# Patient Record
Sex: Male | Born: 1970
Health system: Southern US, Community
[De-identification: ages and names within clinical notes are randomized; demographics above are authoritative.]

## PROBLEM LIST (undated history)

## (undated) DIAGNOSIS — I10 Essential (primary) hypertension: Secondary | ICD-10-CM

## (undated) DIAGNOSIS — E785 Hyperlipidemia, unspecified: Secondary | ICD-10-CM

## (undated) DIAGNOSIS — F419 Anxiety disorder, unspecified: Secondary | ICD-10-CM

## (undated) HISTORY — DX: Essential (primary) hypertension: I10

## (undated) HISTORY — DX: Hyperlipidemia, unspecified: E78.5

## (undated) HISTORY — PX: NO PAST SURGERIES: SHX2092

## (undated) HISTORY — DX: Anxiety disorder, unspecified: F41.9

---

## 2007-11-07 ENCOUNTER — Ambulatory Visit: Payer: Self-pay | Admitting: Family Medicine

## 2009-11-07 IMAGING — US ABDOMEN ULTRASOUND
1 series · 17 of 25 positions shown · non-contrast
Comparison: none

REASON FOR EXAM: abdominal pain
COMMENTS:

[Series 1: abdomen ultrasound · 17 of 63 slices shown]
[im 1/63]
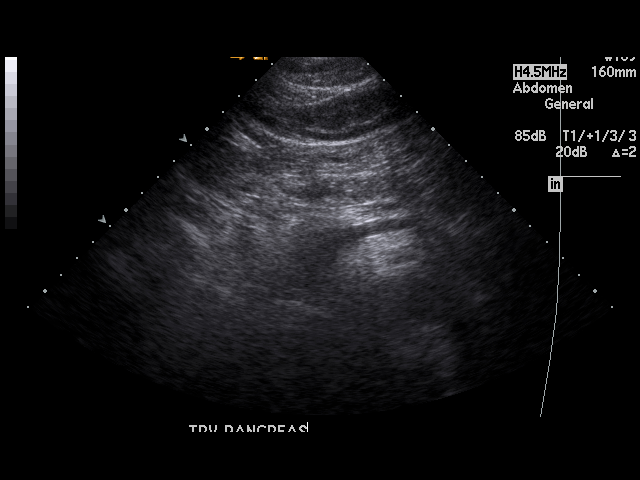
[im 6/63]
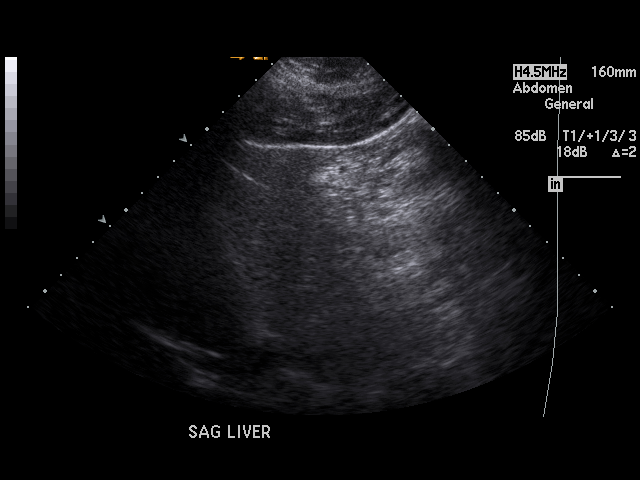
[im 8/63]
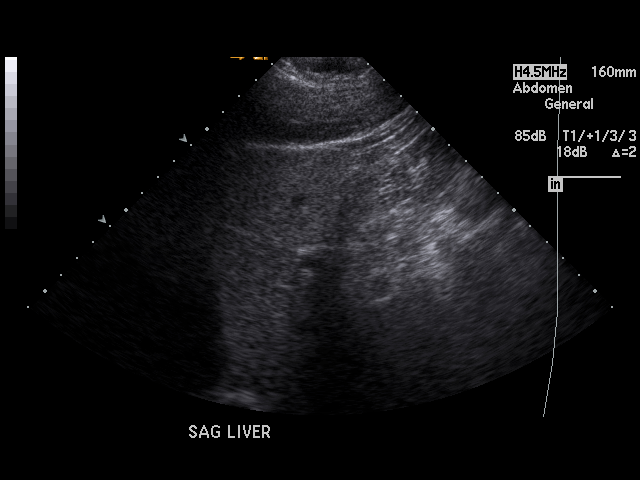
[im 13/63]
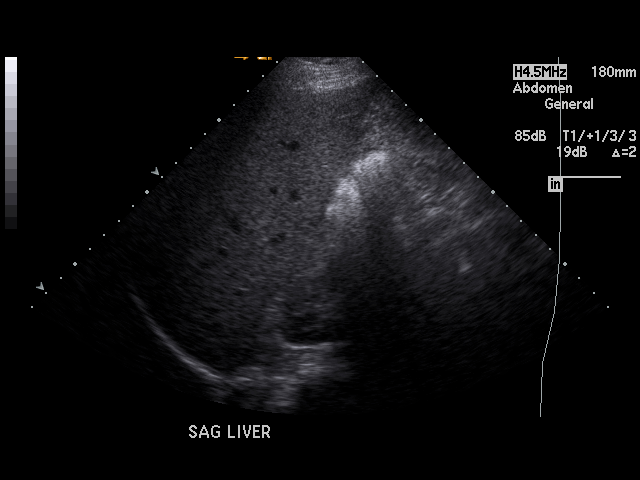
[im 16/63]
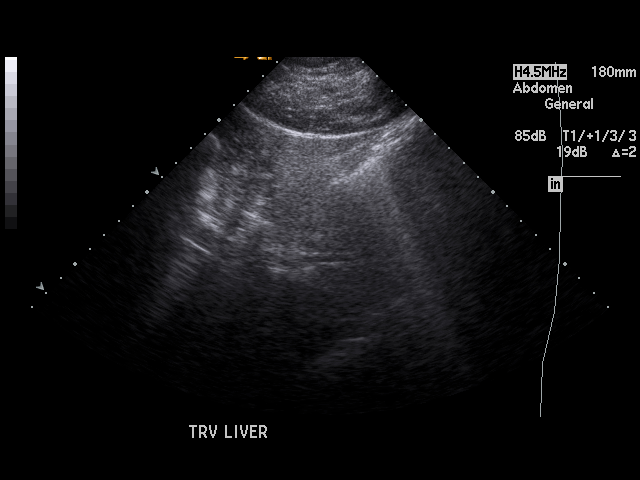
[im 21/63]
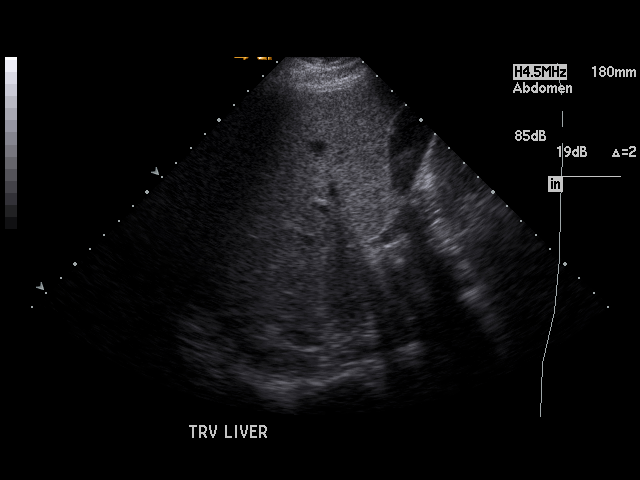
[im 24/63]
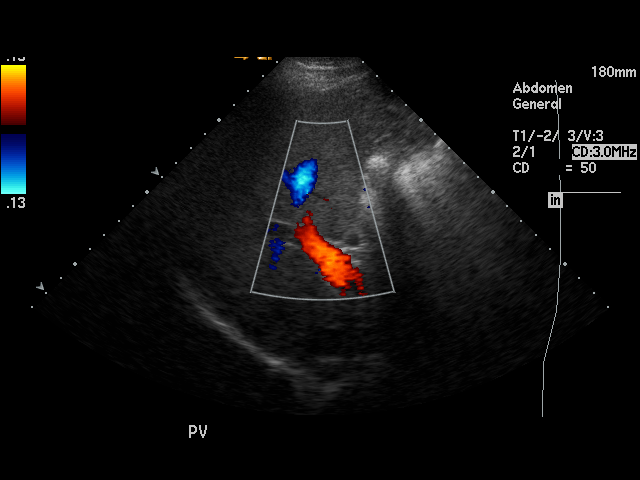
[im 29/63]
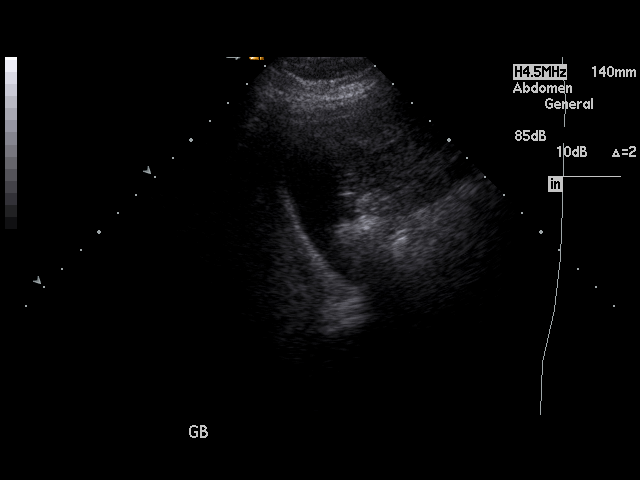
[im 32/63]
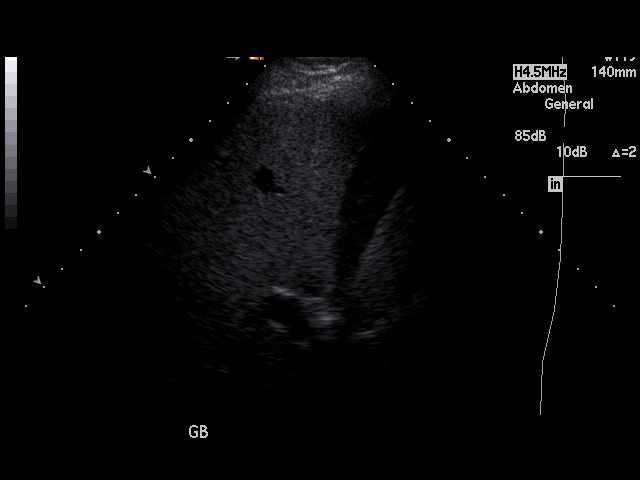
[im 34/63]
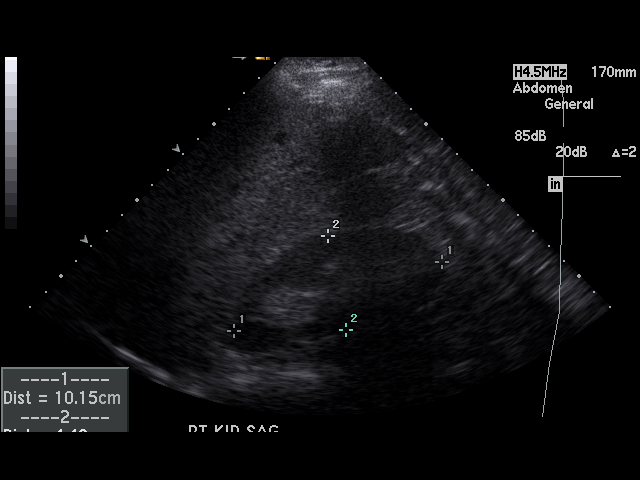
[im 39/63]
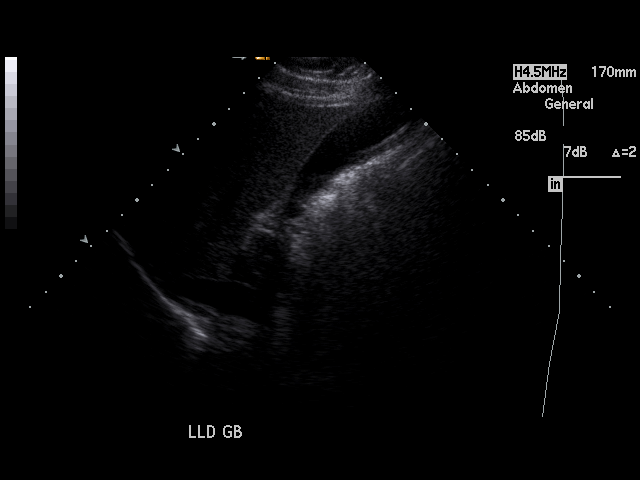
[im 42/63]
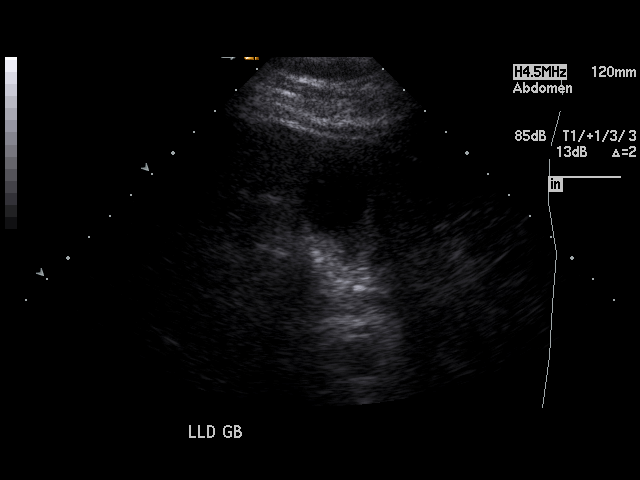
[im 47/63]
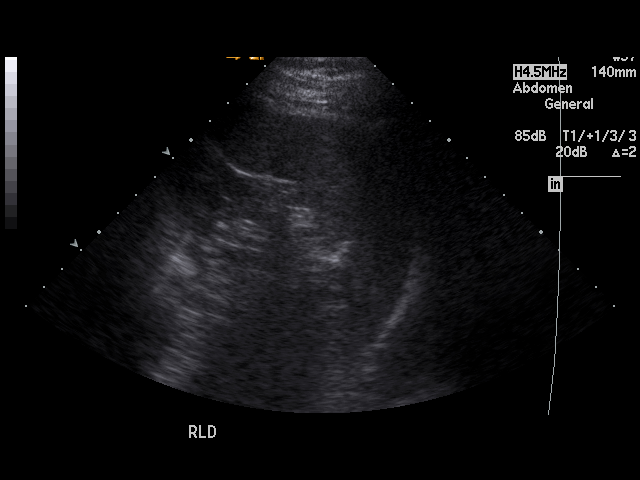
[im 50/63]
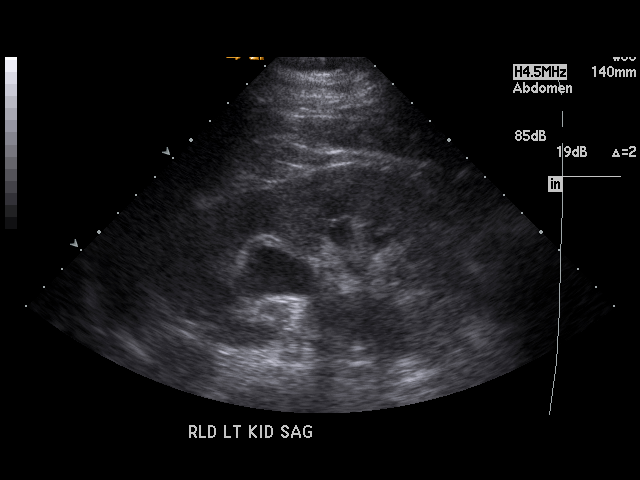
[im 55/63]
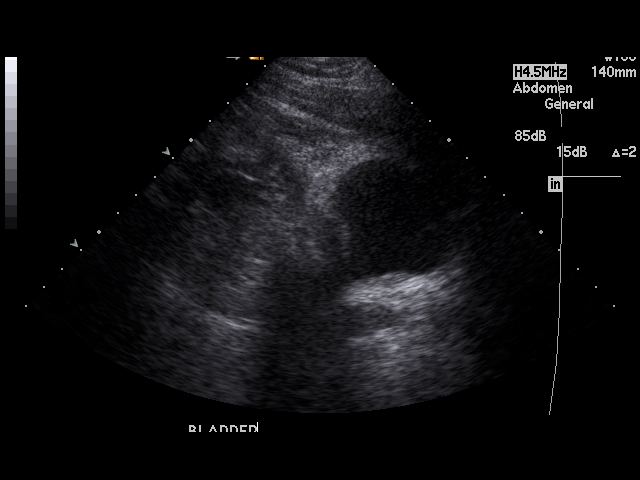
[im 57/63]
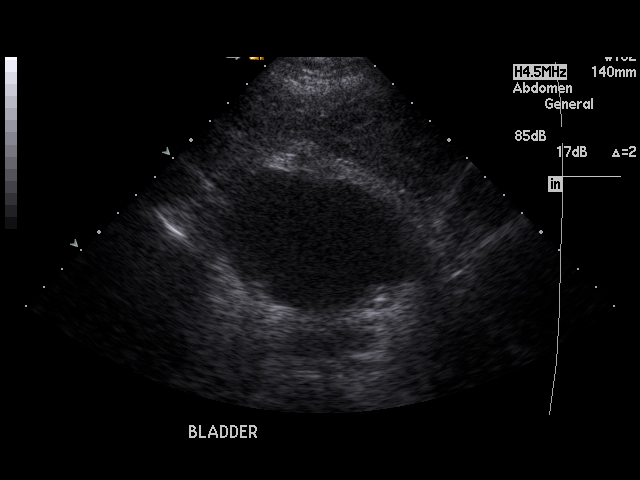
[im 63/63]
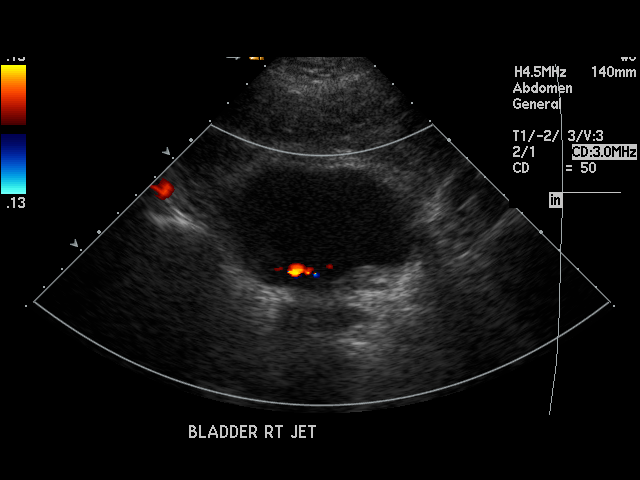

[17 of 25 positions shown; findings below may reference images not displayed]

PROCEDURE:     US  - US ABDOMEN GENERAL SURVEY  - November 07, 2007  [DATE]

RESULT:     The patient is undergoing evaluation for LEFT lower quadrant
abdominal discomfort.

The liver exhibits normal echotexture with no evidence of mass nor of ductal
dilation. Portal venous flow is normal in direction toward the liver. The
gallbladder is adequately distended with no evidence of stones, wall
thickening, or pericholecystic fluid. There is no positive sonographic
Murphy's sign. The common bile duct measures 4.5 mm in diameter. Evaluation
of the pancreas was limited due to bowel gas. The spleen is normal in size
at 10.1 cm. The abdominal aorta is normal in caliber. There is a trace of
hydronephrosis on the LEFT and there is a stone in the region of the
ureterovesical junction measuring 6 mm in greatest dimension. The RIGHT
kidney is normal in appearance.
IMPRESSION: 1. There is mild hydronephrosis on the LEFT which is likely secondary to a
calcified shadowing stone at the LEFT ureterovesical junction.
2. I do not see evidence of acute hepatobiliary abnormality. Evaluation of
the pancreas was limited due to bowel gas.

## 2010-06-14 ENCOUNTER — Ambulatory Visit: Payer: Self-pay | Admitting: Internal Medicine

## 2013-01-09 ENCOUNTER — Emergency Department: Payer: Self-pay | Admitting: Emergency Medicine

## 2013-01-09 LAB — CBC WITH DIFFERENTIAL/PLATELET
Basophil #: 0 10*3/uL (ref 0.0–0.1)
Basophil %: 0.4 %
Eosinophil #: 0.2 10*3/uL (ref 0.0–0.7)
Eosinophil %: 1.9 %
HCT: 40.1 % (ref 40.0–52.0)
HGB: 13.6 g/dL (ref 13.0–18.0)
Lymphocyte #: 2.9 10*3/uL (ref 1.0–3.6)
Lymphocyte %: 25.3 %
MCH: 28.2 pg (ref 26.0–34.0)
MCHC: 34.1 g/dL (ref 32.0–36.0)
MCV: 83 fL (ref 80–100)
Monocyte #: 1.1 x10 3/mm — ABNORMAL HIGH (ref 0.2–1.0)
Monocyte %: 9.4 %
Neutrophil #: 7.2 10*3/uL — ABNORMAL HIGH (ref 1.4–6.5)
Neutrophil %: 63 %
Platelet: 312 10*3/uL (ref 150–440)
RBC: 4.84 10*6/uL (ref 4.40–5.90)
RDW: 12.7 % (ref 11.5–14.5)
WBC: 11.4 10*3/uL — ABNORMAL HIGH (ref 3.8–10.6)

## 2013-01-10 LAB — BASIC METABOLIC PANEL
Anion Gap: 3 — ABNORMAL LOW (ref 7–16)
BUN: 9 mg/dL (ref 7–18)
Calcium, Total: 9.3 mg/dL (ref 8.5–10.1)
Chloride: 102 mmol/L (ref 98–107)
Co2: 30 mmol/L (ref 21–32)
Creatinine: 1.14 mg/dL (ref 0.60–1.30)
EGFR (African American): >60
EGFR (Non-African Amer.): >60
Glucose: 73 mg/dL (ref 65–99)
Osmolality: 267 (ref 275–301)
Potassium: 3.8 mmol/L (ref 3.5–5.1)
Sodium: 135 mmol/L — ABNORMAL LOW (ref 136–145)

## 2013-01-15 LAB — CULTURE, BLOOD (SINGLE)

## 2014-08-08 ENCOUNTER — Ambulatory Visit: Payer: Self-pay | Admitting: Family Medicine

## 2014-08-13 ENCOUNTER — Encounter: Payer: Self-pay | Admitting: Family Medicine

## 2014-08-13 ENCOUNTER — Ambulatory Visit (INDEPENDENT_AMBULATORY_CARE_PROVIDER_SITE_OTHER): Payer: 59 | Admitting: Family Medicine

## 2014-08-13 VITALS — BP 124/80 | HR 66 | Temp 97.2°F | Resp 16 | Ht 72.0 in | Wt 207.3 lb

## 2014-08-13 DIAGNOSIS — E785 Hyperlipidemia, unspecified: Secondary | ICD-10-CM

## 2014-08-13 DIAGNOSIS — Z720 Tobacco use: Secondary | ICD-10-CM | POA: Diagnosis not present

## 2014-08-13 DIAGNOSIS — F419 Anxiety disorder, unspecified: Secondary | ICD-10-CM

## 2014-08-13 DIAGNOSIS — I10 Essential (primary) hypertension: Secondary | ICD-10-CM

## 2014-08-13 MED ORDER — CLONAZEPAM 1 MG PO TABS: 1.0000 mg | ORAL_TABLET | Freq: Two times a day (BID) | ORAL | Status: DC

## 2014-08-13 MED ORDER — ATORVASTATIN CALCIUM 40 MG PO TABS: 40.0000 mg | ORAL_TABLET | Freq: Every day | ORAL | Status: DC

## 2014-08-13 MED ORDER — LISINOPRIL 20 MG PO TABS: 20.0000 mg | ORAL_TABLET | Freq: Every day | ORAL | Status: DC

## 2014-08-13 NOTE — Progress Notes (Signed)
Name: John Ayers   MRN: [id number]    DOB: [birth year]   Date:08/13/2014       Progress Note  Subjective  Chief Complaint  Chief Complaint  Patient presents with  . Hypertension  . Hyperlipidemia  . Anxiety    Hypertension This is a chronic problem. The current episode started more than 1 year ago. The problem is unchanged. The problem is controlled. Associated symptoms include anxiety, headaches and shortness of breath. Pertinent negatives include no blurred vision, chest pain, neck pain, orthopnea or palpitations. There are no associated agents to hypertension. Risk factors for coronary artery disease include dyslipidemia, male gender, sedentary lifestyle, smoking/tobacco exposure and stress. Past treatments include ACE inhibitors. The current treatment provides moderate improvement. Compliance problems: Intermittently noncompliant.   Hyperlipidemia This is a chronic problem. The current episode started more than 1 year ago. Condition status: Patient has not gotten lab drawn. Factors aggravating his hyperlipidemia include fatty foods. Associated symptoms include shortness of breath. Pertinent negatives include no chest pain, focal weakness or myalgias. Risk factors for coronary artery disease include dyslipidemia, hypertension, a sedentary lifestyle and male sex.  Anxiety Presents for follow-up visit. Onset was more than 5 years ago. Symptoms include decreased concentration, excessive worry, insomnia, irritability, muscle tension, nervous/anxious behavior, panic, restlessness, shortness of breath and suicidal ideas. Patient reports no chest pain, dizziness, nausea or palpitations. Symptoms occur most days. The severity of symptoms is moderate. The symptoms are aggravated by caffeine, family issues, social activities, specific phobias, work stress and medication withdrawal.   Risk factors include a major life event. His past medical history is significant for anxiety/panic attacks. Past  treatments include benzodiazephines.      Past Medical History  Diagnosis Date  . Hyperlipidemia   . Hypertension   . Anxiety     History  Substance Use Topics  . Smoking status: Current Every Day Smoker  . Smokeless tobacco: Never Used  . Alcohol Use: No     Current outpatient prescriptions:  .  atorvastatin (LIPITOR) 40 MG tablet, Take 1 tablet (40 mg total) by mouth daily., Disp: 90 tablet, Rfl: 1 .  clonazePAM (KLONOPIN) 1 MG tablet, Take 1 mg by mouth 2 (two) times daily., Disp: , Rfl:  .  lisinopril (PRINIVIL,ZESTRIL) 20 MG tablet, Take 1 tablet (20 mg total) by mouth daily., Disp: 90 tablet, Rfl: 1  Allergies  Allergen Reactions  . Sulfur     Review of Systems  Constitutional: Positive for irritability. Negative for fever, chills and weight loss.  HENT: Negative for congestion, hearing loss, sore throat and tinnitus.   Eyes: Negative for blurred vision, double vision and redness.  Respiratory: Positive for shortness of breath. Negative for cough and hemoptysis.   Cardiovascular: Negative for chest pain, palpitations, orthopnea, claudication and leg swelling.  Gastrointestinal: Negative for heartburn, nausea, vomiting, diarrhea, constipation and blood in stool.  Genitourinary: Negative for dysuria, urgency, frequency and hematuria.  Musculoskeletal: Negative for myalgias, back pain, joint pain, falls and neck pain.  Skin: Negative for itching.  Neurological: Positive for headaches. Negative for dizziness, tingling, tremors, focal weakness, seizures, loss of consciousness and weakness.  Endo/Heme/Allergies: Does not bruise/bleed easily.  Psychiatric/Behavioral: Positive for suicidal ideas and decreased concentration. Negative for depression and substance abuse. The patient is nervous/anxious and has insomnia.      Objective  Filed Vitals:   08/13/14 1005  BP: 124/80  Pulse: 66  Temp: 97.2 F (36.2 C)  TempSrc: Oral  Resp: 16  Height: 6' (  1.829 m)  Weight:  207 lb 4.8 oz (94.031 kg)  SpO2: 97%     Physical Exam  Constitutional: He is oriented to person, place, and time and well-developed, well-nourished, and in no distress.  HENT:  Head: Normocephalic.  Eyes: EOM are normal. Pupils are equal, round, and reactive to light.  Neck: Normal range of motion. Neck supple. No thyromegaly present.  Cardiovascular: Normal rate, regular rhythm and normal heart sounds.   No murmur heard. Pulmonary/Chest: Effort normal and breath sounds normal. No respiratory distress. He has no wheezes.  Abdominal: Soft. Bowel sounds are normal.  Musculoskeletal: Normal range of motion. He exhibits no edema.  Lymphadenopathy:    He has no cervical adenopathy.  Neurological: He is alert and oriented to person, place, and time. No cranial nerve deficit. Gait normal. Coordination normal.  Skin: Skin is warm and dry. No rash noted.  Psychiatric: Judgment normal.  Depressed appearing affect anxious      Assessment & Plan    1. Acute anxiety Stable on medications  - clonazePAM (KLONOPIN) 1 MG tablet; Take 1 tablet (1 mg total) by mouth 2 (two) times daily.  Dispense: 60 tablet; Refill: 2 - Comprehensive metabolic panel  2. Hyperlipidemia Patient has repeatedly not had his lab work done stating that he can never get it done fasting even though he currently has insurance. We'll therefore obtain a cholesterol and HDL and conference metabolic panel - atorvastatin (LIPITOR) 40 MG tablet; Take 1 tablet (40 mg total) by mouth daily.  Dispense: 90 tablet; Refill: 1 - clonazePAM (KLONOPIN) 1 MG tablet; Take 1 tablet (1 mg total) by mouth 2 (two) times daily.  Dispense: 60 tablet; Refill: 2 - Comprehensive metabolic panel - Cholesterol, Total  3. Tobacco abuse Discussed the patient is not ready to discontinue smoking at this time.  4. Htn   Well-controlled currently

## 2015-06-04 ENCOUNTER — Other Ambulatory Visit: Payer: Self-pay

## 2015-06-04 NOTE — Telephone Encounter (Signed)
Patient needs an appt and labs please I don't see any labs on him for the last year, but Dr. Thana AtesMorrisey wanted him to get labs at last visit in August 2016

## 2015-06-05 NOTE — Telephone Encounter (Signed)
Left voivemail 

## 2015-09-24 ENCOUNTER — Ambulatory Visit: Payer: Self-pay | Admitting: Family Medicine

## 2015-09-24 ENCOUNTER — Ambulatory Visit (INDEPENDENT_AMBULATORY_CARE_PROVIDER_SITE_OTHER): Payer: Self-pay | Admitting: Family Medicine

## 2015-09-24 ENCOUNTER — Encounter: Payer: Self-pay | Admitting: Family Medicine

## 2015-09-24 DIAGNOSIS — E785 Hyperlipidemia, unspecified: Secondary | ICD-10-CM

## 2015-09-24 DIAGNOSIS — F419 Anxiety disorder, unspecified: Secondary | ICD-10-CM

## 2015-09-24 DIAGNOSIS — I10 Essential (primary) hypertension: Secondary | ICD-10-CM

## 2015-09-24 MED ORDER — LISINOPRIL 20 MG PO TABS: 20.0000 mg | ORAL_TABLET | Freq: Every day | ORAL | 1 refills | Status: DC

## 2015-09-24 MED ORDER — ATORVASTATIN CALCIUM 40 MG PO TABS: 40.0000 mg | ORAL_TABLET | Freq: Every day | ORAL | 1 refills | Status: DC

## 2015-09-24 MED ORDER — CLONAZEPAM 1 MG PO TABS: 1.0000 mg | ORAL_TABLET | Freq: Two times a day (BID) | ORAL | 2 refills | Status: DC

## 2015-09-24 NOTE — Progress Notes (Signed)
Name: John Ayers   MRN: [id number]    DOB: January 28, 1970   Date:09/24/2015       Progress Note  Subjective  Chief Complaint  Chief Complaint  Patient presents with  . Medication Refill   This patient is usually followed by Dr. Thana Ates, new to me  Hyperlipidemia  This is a chronic problem. The problem is uncontrolled. Recent lipid tests were reviewed and are high (Last lipid panel was 2-3 years ago, according to the patient, he had high LDL, low HDL.). Pertinent negatives include no chest pain (has had intermittent chest pain, none currently), leg pain, myalgias or shortness of breath. Current antihyperlipidemic treatment includes statins.  Hypertension  This is a chronic problem. The problem is unchanged. Associated symptoms include anxiety and headaches (had headache on Saturday). Pertinent negatives include no blurred vision, chest pain (has had intermittent chest pain, none currently), palpitations or shortness of breath. There is no history of kidney disease, CAD/MI or CVA.  Anxiety  Presents for initial visit. Symptoms include excessive worry, insomnia and nervous/anxious behavior. Patient reports no chest pain (has had intermittent chest pain, none currently), irritability, palpitations or shortness of breath. The severity of symptoms is moderate.   Risk factors include family history. His past medical history is significant for anxiety/panic attacks. Past treatments include benzodiazephines. Compliance with prior treatments has been good.     Past Medical History:  Diagnosis Date  . Anxiety   . Hyperlipidemia   . Hypertension     Past Surgical History:  Procedure Laterality Date  . NO PAST SURGERIES      Family History  Problem Relation Age of Onset  . Hypertension Mother     Social History   Social History  . Marital status: Married    Spouse name: N/A  . Number of children: N/A  . Years of education: N/A   Occupational History  . Not on file.   Social  History Main Topics  . Smoking status: Current Every Day Smoker  . Smokeless tobacco: Never Used  . Alcohol use No  . Drug use: No  . Sexual activity: Yes    Partners: Female   Other Topics Concern  . Not on file   Social History Narrative  . No narrative on file     Current Outpatient Prescriptions:  .  atorvastatin (LIPITOR) 40 MG tablet, Take 1 tablet (40 mg total) by mouth daily., Disp: 90 tablet, Rfl: 1 .  clonazePAM (KLONOPIN) 1 MG tablet, Take 1 tablet (1 mg total) by mouth 2 (two) times daily., Disp: 60 tablet, Rfl: 2 .  lisinopril (PRINIVIL,ZESTRIL) 20 MG tablet, Take 1 tablet (20 mg total) by mouth daily., Disp: 90 tablet, Rfl: 1  Allergies  Allergen Reactions  . Sulfur      Review of Systems  Constitutional: Negative for irritability.  Eyes: Negative for blurred vision.  Respiratory: Negative for shortness of breath.   Cardiovascular: Negative for chest pain (has had intermittent chest pain, none currently) and palpitations.  Musculoskeletal: Negative for myalgias.  Neurological: Positive for headaches (had headache on Saturday).  Psychiatric/Behavioral: The patient is nervous/anxious and has insomnia.     Objective  Vitals:   09/24/15 1056  BP: 130/80  Pulse: 67  Resp: 17  Temp: 97.8 F (36.6 C)  TempSrc: Oral  SpO2: 97%  Weight: 193 lb 14.4 oz (88 kg)  Height: 6' (1.829 m)    Physical Exam  Constitutional: He is well-developed, well-nourished, and in no distress.  HENT:  Head: Normocephalic and atraumatic.  Cardiovascular: Normal rate, regular rhythm, S1 normal, S2 normal and normal heart sounds.   No murmur heard. Pulmonary/Chest: Effort normal and breath sounds normal. He has no wheezes.  Abdominal: Soft. Bowel sounds are normal. There is no tenderness.  Musculoskeletal:       Right ankle: He exhibits no swelling.       Left ankle: He exhibits no swelling.  Psychiatric: Mood, memory, affect and judgment normal.  Nursing note and vitals  reviewed.   Assessment & Plan  1. Essential hypertension BP stable and controlled on present antihypertensive therapy - lisinopril (PRINIVIL,ZESTRIL) 20 MG tablet; Take 1 tablet (20 mg total) by mouth daily.  Dispense: 90 tablet; Refill: 1  2. Anxiety Continue on clonazepam 1 mg twice a day when necessary for episodes of anxiety. Refills provide - clonazePAM (KLONOPIN) 1 MG tablet; Take 1 tablet (1 mg total) by mouth 2 (two) times daily.  Dispense: 60 tablet; Refill: 2  3. Hyperlipidemia We'll obtain updated fasting lipid panel, continue on statin therapy - atorvastatin (LIPITOR) 40 MG tablet; Take 1 tablet (40 mg total) by mouth daily.  Dispense: 90 tablet; Refill: 1 - Lipid Profile - COMPLETE METABOLIC PANEL WITH GFR   John Ayers John Ayers [facility] 09/24/2015 11:01 AM

## 2016-01-15 ENCOUNTER — Ambulatory Visit: Payer: Self-pay | Admitting: Family Medicine

## 2017-03-08 ENCOUNTER — Ambulatory Visit: Payer: BLUE CROSS/BLUE SHIELD | Admitting: Family Medicine

## 2017-03-08 ENCOUNTER — Encounter: Payer: Self-pay | Admitting: Family Medicine

## 2017-03-08 VITALS — BP 180/86 | HR 80 | Temp 98.2°F | Resp 18 | Ht 72.0 in | Wt 198.9 lb

## 2017-03-08 DIAGNOSIS — I10 Essential (primary) hypertension: Secondary | ICD-10-CM

## 2017-03-08 DIAGNOSIS — Z131 Encounter for screening for diabetes mellitus: Secondary | ICD-10-CM | POA: Diagnosis not present

## 2017-03-08 DIAGNOSIS — F419 Anxiety disorder, unspecified: Secondary | ICD-10-CM

## 2017-03-08 DIAGNOSIS — E785 Hyperlipidemia, unspecified: Secondary | ICD-10-CM | POA: Diagnosis not present

## 2017-03-08 LAB — COMPLETE METABOLIC PANEL WITH GFR
AG Ratio: 1.5 (calc) (ref 1.0–2.5)
ALT: 22 U/L (ref 9–46)
AST: 23 U/L (ref 10–40)
Albumin: 4.5 g/dL (ref 3.6–5.1)
Alkaline phosphatase (APISO): 63 U/L (ref 40–115)
BUN: 12 mg/dL (ref 7–25)
CO2: 31 mmol/L (ref 20–32)
Calcium: 10 mg/dL (ref 8.6–10.3)
Chloride: 100 mmol/L (ref 98–110)
Creat: 1.09 mg/dL (ref 0.60–1.35)
GFR, Est African American: 93 mL/min/{1.73_m2} (ref >=60)
GFR, Est Non African American: 80 mL/min/{1.73_m2} (ref >=60)
Globulin: 3 g/dL (calc) (ref 1.9–3.7)
Glucose, Bld: 88 mg/dL (ref 65–139)
Potassium: 4.4 mmol/L (ref 3.5–5.3)
Sodium: 138 mmol/L (ref 135–146)
Total Bilirubin: 1.1 mg/dL (ref 0.2–1.2)
Total Protein: 7.5 g/dL (ref 6.1–8.1)

## 2017-03-08 LAB — LIPID PANEL
Cholesterol: 235 mg/dL — ABNORMAL HIGH (ref <200)
HDL: 45 mg/dL (ref >40)
LDL Cholesterol (Calc): 171 mg/dL (calc) — ABNORMAL HIGH
Non-HDL Cholesterol (Calc): 190 mg/dL (calc) — ABNORMAL HIGH (ref <130)
Total CHOL/HDL Ratio: 5.2 (calc) — ABNORMAL HIGH (ref <5.0)
Triglycerides: 85 mg/dL (ref <150)

## 2017-03-08 LAB — TSH: TSH: 1.51 mIU/L (ref 0.40–4.50)

## 2017-03-08 MED ORDER — LISINOPRIL 20 MG PO TABS: 20.0000 mg | ORAL_TABLET | Freq: Every day | ORAL | 0 refills | Status: DC

## 2017-03-08 MED ORDER — LISINOPRIL-HYDROCHLOROTHIAZIDE 20-12.5 MG PO TABS: 1.0000 | ORAL_TABLET | Freq: Every day | ORAL | 0 refills | Status: DC

## 2017-03-08 NOTE — Progress Notes (Signed)
Name: John Ayers   MRN: [id number]    DOB: [birth year]   Date:03/08/2017       Progress Note  Subjective  Chief Complaint  Chief Complaint  Patient presents with  . Hypertension    elevated blood pressure, no medication for years    HPI  Pt presents to re-establish - he was seen last in 2017 by Dr. Sherryll Burger, never returned for follow up and has not been taking any of his medications.   Hyperlipidemia  Has been out of medications for almost 2 years. We will check lipids today - he used to be prescribed atorvastatin but he was non-compliant; will determine need based on lipids today. Denies shortness of breath, myalgias.  Occasionaly has chest pain - says this is always related to his anxiety.   - Eats biscuit on the way to work, drinks [employer] and coffee each morning; lunch is usually a sandwich at home or fast food; dinner is usually meat/starch/veggie - they do fry some foods at home. - Drank cup of coffee with creamer/sugar and a [employer] today, otherwise has not had anything to eat.  We will check CMP and Lipids today to ensure labs are performed.  Hypertension  Has had uncontrolled HTN for many years, has been non-compliant with medications and has not taken Lisinopril in about 2 years. Will have occasional headaches 1 time eery 1-2 weeks. Denies shortness of breath, vision changes, BLE edema; has occasional chest pain which he states is always related to his anxiety.  There is no history of kidney disease, CAD/MI or CVA. Used to take 20mg  Lisinopril and tolerated well.  Diet is not good at this time (see above) - does not avoid salt - discussed DASH diet.  Anxiety  Pt has struggled with anxiety for years, was taking klonopin PRN but ran out.  He has never taken daily medications or counseling.  He is not interested in either.  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  Denies crying spells or panic attacks, shortness of breath; every now and then he'll experience chest tightness but it's associated with anxiety and usually goes away after he calms himself down.  Sleep is adequate, will wake up every now and then.   Patient Active Problem List   Diagnosis Date Noted  . Hypertension 09/24/2015  . Anxiety 09/24/2015  . Hyperlipidemia 09/24/2015    Past Surgical History:  Procedure Laterality Date  . NO PAST SURGERIES      Family History  Problem Relation Age of Onset  . Hypertension Mother     Social History   Socioeconomic History  . Marital status: Married    Spouse name: Not on file  . Number of children: Not on file  . Years of education: Not on file  . Highest education level: Not on file  Social Needs  . Financial resource strain: Not on file  . Food insecurity - worry: Not on file  . Food insecurity - inability: Not on file  . Transportation needs - medical: Not on file  . Transportation needs - non-medical: Not on file  Occupational History  . Not on file  Tobacco Use  . Smoking status: Current Every Day Smoker  . Smokeless tobacco: Never Used  Substance and Sexual Activity  .  Alcohol use: No    Alcohol/week: 0.0 oz  . Drug use: No  . Sexual activity: Yes    Partners: Female  Other Topics Concern  . Not on file  Social History Narrative  . Not on file     Current Outpatient Medications:  .  atorvastatin (LIPITOR) 40 MG tablet, Take 1 tablet (40 mg total) by mouth daily. (Patient not taking: Reported on 03/08/2017), Disp: 90 tablet, Rfl: 1 .  lisinopril-hydrochlorothiazide (ZESTORETIC) 20-12.5 MG tablet, Take 1 tablet by mouth daily., Disp: 30 tablet, Rfl: 0  Allergies  Allergen Reactions  . Sulfur     ROS Constitutional: Negative for fever or weight change.  Respiratory: Negative for cough and shortness of breath.   Cardiovascular:  Negative for chest pain or palpitations.  Gastrointestinal: Negative for abdominal pain, no bowel changes.  Musculoskeletal: Negative for gait problem or joint swelling.  Skin: Negative for rash.  Neurological: Negative for dizziness or headache.  No other specific complaints in a complete review of systems (except as listed in HPI above).  Objective  Vitals:   03/08/17 1012 03/08/17 1121  BP: (!) 200/86 (!) 180/86  Pulse: 80   Resp: 18   Temp: 98.2 F (36.8 C)   TempSrc: Oral   SpO2: 97%   Weight: 198 lb 14.4 oz (90.2 kg)   Height: 6' (1.829 m)    Body mass index is 26.98 kg/m.  Physical Exam Constitutional: Patient appears well-developed and well-nourished. No distress.  HENT: Head: Normocephalic and atraumatic. Nose: Nose normal. Mouth/Throat: Oropharynx is clear and moist. No oropharyngeal exudate.  Eyes: Conjunctivae and EOM are normal. Pupils are equal, round, and reactive to light. No scleral icterus.  Neck: Normal range of motion. Neck supple. No JVD present. No thyromegaly present.  Cardiovascular: Normal rate, regular rhythm and normal heart sounds.  No murmur heard. No BLE edema. Pulmonary/Chest: Effort normal and breath sounds normal. No respiratory distress. Musculoskeletal: Normal range of motion, no joint effusions. No gross deformities Neurological: he is alert and oriented to person, place, and time. No cranial nerve deficit. Coordination, balance, strength, speech and gait are normal.  Skin: Skin is warm and dry. No rash noted. No erythema.  Psychiatric: Patient has a normal mood and affect. behavior is normal. Judgment and thought content normal.  No results found for this or any previous visit (from the past 72 hour(s)).  PHQ2/9: Depression screen [facility] 2/9 03/08/2017 09/24/2015  Decreased Interest 0 0  Down, Depressed, Hopeless 0 0  PHQ - 2 Score 0 0   Fall Risk: Fall Risk  03/08/2017 09/24/2015  Falls in the past year? No No   Assessment & Plan   1.  Essential hypertension - DASH diet discussed in detail as well as need for regular exercise and signs and symptoms of stroke/MI. - COMPLETE METABOLIC PANEL WITH GFR - TSH - lisinopril-hydrochlorothiazide (ZESTORETIC) 20-12.5 MG tablet; Take 1 tablet by mouth daily.  Dispense: 30 tablet; Refill: 0  2. Hyperlipidemia, unspecified hyperlipidemia type - Discussed importance of 150 minutes of physical activity weekly, eat two servings of fish weekly, eat one serving of tree nuts ( cashews, pistachios, pecans, almonds.Marland Kitchen) every other day, eat 6 servings of fruit/vegetables daily and drink plenty of water and avoid sweet beverages.  - Lipid panel  3. Anxiety - Declines daily medication at this time, is comfortable not taking medication at this time. Advised to monitor his symptoms and return if worsening.  4. Diabetes mellitus screening - Hemoglobin A1c  Return  in about 1 month (around 04/08/2017) for **Follow up Friday 03/11/17 for BP check**; F/U 04/08/17 for CPE w/ Follow Up.   -Red flags and when to present for emergency care or RTC including fever >101.51F, chest pain, shortness of breath, new/worsening/un-resolving symptoms, blurred vision, severe headache, facial droop, slurred speech, confusion, extremity weakness, reviewed with patient at time of visit. Follow up and care instructions discussed and provided in AVS.

## 2017-03-08 NOTE — Patient Instructions (Addendum)
DASH Eating Plan DASH stands for "Dietary Approaches to Stop Hypertension." The DASH eating plan is a healthy eating plan that has been shown to reduce high blood pressure (hypertension). It may also reduce your risk for type 2 diabetes, heart disease, and stroke. The DASH eating plan may also help with weight loss. What are tips for following this plan? General guidelines  Avoid eating more than 2,300 mg (milligrams) of salt (sodium) a day. If you have hypertension, you may need to reduce your sodium intake to 1,500 mg a day.  Limit alcohol intake to no more than 1 drink a day for nonpregnant women and 2 drinks a day for men. One drink equals 12 oz of beer, 5 oz of wine, or 1 oz of hard liquor.  Work with your health care provider to maintain a healthy body weight or to lose weight. Ask what an ideal weight is for you.  Get at least 30 minutes of exercise that causes your heart to beat faster (aerobic exercise) most days of the week. Activities may include walking, swimming, or biking.  Work with your health care provider or diet and nutrition specialist (dietitian) to adjust your eating plan to your individual calorie needs. Reading food labels  Check food labels for the amount of sodium per serving. Choose foods with less than 5 percent of the Daily Value of sodium. Generally, foods with less than 300 mg of sodium per serving fit into this eating plan.  To find whole grains, look for the word "whole" as the first word in the ingredient list. Shopping  Buy products labeled as "low-sodium" or "no salt added."  Buy fresh foods. Avoid canned foods and premade or frozen meals. Cooking  Avoid adding salt when cooking. Use salt-free seasonings or herbs instead of table salt or sea salt. Check with your health care provider or pharmacist before using salt substitutes.  Do not fry foods. Cook foods using healthy methods such as baking, boiling, grilling, and broiling instead.  Cook with  heart-healthy oils, such as olive, canola, soybean, or sunflower oil. Meal planning   Eat a balanced diet that includes: ? 5 or more servings of fruits and vegetables each day. At each meal, try to fill half of your plate with fruits and vegetables. ? Up to 6-8 servings of whole grains each day. ? Less than 6 oz of lean meat, poultry, or fish each day. A 3-oz serving of meat is about the same size as a deck of cards. One egg equals 1 oz. ? 2 servings of low-fat dairy each day. ? A serving of nuts, seeds, or beans 5 times each week. ? Heart-healthy fats. Healthy fats called Omega-3 fatty acids are found in foods such as flaxseeds and coldwater fish, like sardines, salmon, and mackerel.  Limit how much you eat of the following: ? Canned or prepackaged foods. ? Food that is high in trans fat, such as fried foods. ? Food that is high in saturated fat, such as fatty meat. ? Sweets, desserts, sugary drinks, and other foods with added sugar. ? Full-fat dairy products.  Do not salt foods before eating.  Try to eat at least 2 vegetarian meals each week.  Eat more home-cooked food and less restaurant, buffet, and fast food.  When eating at a restaurant, ask that your food be prepared with less salt or no salt, if possible. What foods are recommended? The items listed may not be a complete list. Talk with your dietitian about what   dietary choices are best for you. Grains Whole-grain or whole-wheat bread. Whole-grain or whole-wheat pasta. Brown rice. Oatmeal. Quinoa. Bulgur. Whole-grain and low-sodium cereals. Pita bread. Low-fat, low-sodium crackers. Whole-wheat flour tortillas. Vegetables Fresh or frozen vegetables (raw, steamed, roasted, or grilled). Low-sodium or reduced-sodium tomato and vegetable juice. Low-sodium or reduced-sodium tomato sauce and tomato paste. Low-sodium or reduced-sodium canned vegetables. Fruits All fresh, dried, or frozen fruit. Canned fruit in natural juice (without  added sugar). Meat and other protein foods Skinless chicken or turkey. Ground chicken or turkey. Pork with fat trimmed off. Fish and seafood. Egg whites. Dried beans, peas, or lentils. Unsalted nuts, nut butters, and seeds. Unsalted canned beans. Lean cuts of beef with fat trimmed off. Low-sodium, lean deli meat. Dairy Low-fat (1%) or fat-free (skim) milk. Fat-free, low-fat, or reduced-fat cheeses. Nonfat, low-sodium ricotta or cottage cheese. Low-fat or nonfat yogurt. Low-fat, low-sodium cheese. Fats and oils Soft margarine without trans fats. Vegetable oil. Low-fat, reduced-fat, or light mayonnaise and salad dressings (reduced-sodium). Canola, safflower, olive, soybean, and sunflower oils. Avocado. Seasoning and other foods Herbs. Spices. Seasoning mixes without salt. Unsalted popcorn and pretzels. Fat-free sweets. What foods are not recommended? The items listed may not be a complete list. Talk with your dietitian about what dietary choices are best for you. Grains Baked goods made with fat, such as croissants, muffins, or some breads. Dry pasta or rice meal packs. Vegetables Creamed or fried vegetables. Vegetables in a cheese sauce. Regular canned vegetables (not low-sodium or reduced-sodium). Regular canned tomato sauce and paste (not low-sodium or reduced-sodium). Regular tomato and vegetable juice (not low-sodium or reduced-sodium). Pickles. Olives. Fruits Canned fruit in a light or heavy syrup. Fried fruit. Fruit in cream or butter sauce. Meat and other protein foods Fatty cuts of meat. Ribs. Fried meat. Bacon. Sausage. Bologna and other processed lunch meats. Salami. Fatback. Hotdogs. Bratwurst. Salted nuts and seeds. Canned beans with added salt. Canned or smoked fish. Whole eggs or egg yolks. Chicken or turkey with skin. Dairy Whole or 2% milk, cream, and half-and-half. Whole or full-fat cream cheese. Whole-fat or sweetened yogurt. Full-fat cheese. Nondairy creamers. Whipped toppings.  Processed cheese and cheese spreads. Fats and oils Butter. Stick margarine. Lard. Shortening. Ghee. Bacon fat. Tropical oils, such as coconut, palm kernel, or palm oil. Seasoning and other foods Salted popcorn and pretzels. Onion salt, garlic salt, seasoned salt, table salt, and sea salt. Worcestershire sauce. Tartar sauce. Barbecue sauce. Teriyaki sauce. Soy sauce, including reduced-sodium. Steak sauce. Canned and packaged gravies. Fish sauce. Oyster sauce. Cocktail sauce. Horseradish that you find on the shelf. Ketchup. Mustard. Meat flavorings and tenderizers. Bouillon cubes. Hot sauce and Tabasco sauce. Premade or packaged marinades. Premade or packaged taco seasonings. Relishes. Regular salad dressings. Where to find more information:  National Heart, Lung, and Blood Institute: www.nhlbi.nih.gov  American Heart Association: www.heart.org Summary  The DASH eating plan is a healthy eating plan that has been shown to reduce high blood pressure (hypertension). It may also reduce your risk for type 2 diabetes, heart disease, and stroke.  With the DASH eating plan, you should limit salt (sodium) intake to 2,300 mg a day. If you have hypertension, you may need to reduce your sodium intake to 1,500 mg a day.  When on the DASH eating plan, aim to eat more fresh fruits and vegetables, whole grains, lean proteins, low-fat dairy, and heart-healthy fats.  Work with your health care provider or diet and nutrition specialist (dietitian) to adjust your eating plan to your individual   calorie needs. This information is not intended to replace advice given to you by your health care provider. Make sure you discuss any questions you have with your health care provider. Document Released: 12/10/2010 Document Revised: 12/15/2015 Document Reviewed: 12/15/2015 Elsevier Interactive Patient Education  2018 Elsevier Inc.  Hypertension Hypertension is another name for high blood pressure. High blood pressure  forces your heart to work harder to pump blood. This can cause problems over time. There are two numbers in a blood pressure reading. There is a top number (systolic) over a bottom number (diastolic). It is best to have a blood pressure below 120/80. Healthy choices can help lower your blood pressure. You may need medicine to help lower your blood pressure if:  Your blood pressure cannot be lowered with healthy choices.  Your blood pressure is higher than 130/80.  Follow these instructions at home: Eating and drinking  If directed, follow the DASH eating plan. This diet includes: ? Filling half of your plate at each meal with fruits and vegetables. ? Filling one quarter of your plate at each meal with whole grains. Whole grains include whole wheat pasta, brown rice, and whole grain bread. ? Eating or drinking low-fat dairy products, such as skim milk or low-fat yogurt. ? Filling one quarter of your plate at each meal with low-fat (lean) proteins. Low-fat proteins include fish, skinless chicken, eggs, beans, and tofu. ? Avoiding fatty meat, cured and processed meat, or chicken with skin. ? Avoiding premade or processed food.  Eat less than 1,500 mg of salt (sodium) a day.  Limit alcohol use to no more than 1 drink a day for nonpregnant women and 2 drinks a day for men. One drink equals 12 oz of beer, 5 oz of wine, or 1 oz of hard liquor. Lifestyle  Work with your doctor to stay at a healthy weight or to lose weight. Ask your doctor what the best weight is for you.  Get at least 30 minutes of exercise that causes your heart to beat faster (aerobic exercise) most days of the week. This may include walking, swimming, or biking.  Get at least 30 minutes of exercise that strengthens your muscles (resistance exercise) at least 3 days a week. This may include lifting weights or pilates.  Do not use any products that contain nicotine or tobacco. This includes cigarettes and e-cigarettes. If you  need help quitting, ask your doctor.  Check your blood pressure at home as told by your doctor.  Keep all follow-up visits as told by your doctor. This is important. Medicines  Take over-the-counter and prescription medicines only as told by your doctor. Follow directions carefully.  Do not skip doses of blood pressure medicine. The medicine does not work as well if you skip doses. Skipping doses also puts you at risk for problems.  Ask your doctor about side effects or reactions to medicines that you should watch for. Contact a doctor if:  You think you are having a reaction to the medicine you are taking.  You have headaches that keep coming back (recurring).  You feel dizzy.  You have swelling in your ankles.  You have trouble with your vision. Get help right away if:  You get a very bad headache.  You start to feel confused.  You feel weak or numb.  You feel faint.  You get very bad pain in your: ? Chest. ? Belly (abdomen).  You throw up (vomit) more than once.  You have trouble breathing.   Summary  Hypertension is another name for high blood pressure.  Making healthy choices can help lower blood pressure. If your blood pressure cannot be controlled with healthy choices, you may need to take medicine. This information is not intended to replace advice given to you by your health care provider. Make sure you discuss any questions you have with your health care provider. Document Released: 06/09/2007 Document Revised: 11/19/2015 Document Reviewed: 11/19/2015 Elsevier Interactive Patient Education  2018 Elsevier Inc.  

## 2017-03-11 ENCOUNTER — Telehealth: Payer: Self-pay | Admitting: Family Medicine

## 2017-03-11 NOTE — Telephone Encounter (Signed)
-----   Message from Doren CustardEmily E Leasa Kincannon, FNP sent at 03/08/2017 11:47 AM EST ----- Regarding: Call patient to remind him to come in today for BP check Please call patient to remind him to come in today for BP check.  Stress that it is very important that we recheck.

## 2017-03-11 NOTE — Telephone Encounter (Signed)
Left patient message with his wife. his voice mail  was full

## 2017-04-04 ENCOUNTER — Ambulatory Visit: Payer: BLUE CROSS/BLUE SHIELD | Admitting: Emergency Medicine

## 2017-04-04 VITALS — BP 164/86 | HR 83 | Resp 18

## 2017-04-04 DIAGNOSIS — I10 Essential (primary) hypertension: Secondary | ICD-10-CM

## 2017-04-04 NOTE — Progress Notes (Deleted)
Patient presents to clinic for nurse visit- blood pressure check. Patient blood pressure elevated and endorses shob intermittent today and over the weekend as well as palpitations. Patient appears in NAD, able to speak full sentences without issue, skin warm and dry. Lungs clear bilaterally throughout, S1/S1 auscultated with extra beats. No openings today- [facility]

## 2017-04-05 NOTE — Progress Notes (Deleted)
Patient presents to clinic for nurse visit- blood pressure check. Patient blood pressure elevated and endorses shob intermittent today and over the weekend as well as palpitations. Patient appears in NAD, able to speak full sentences without issue, skin warm and dry. Lungs clear bilaterally throughout, S1/S1 auscultated with extra beats. No openings today- will send to [facility] for follow-up.  

## 2017-04-08 ENCOUNTER — Encounter: Payer: BLUE CROSS/BLUE SHIELD | Admitting: Family Medicine

## 2017-04-09 ENCOUNTER — Other Ambulatory Visit: Payer: Self-pay | Admitting: Family Medicine

## 2017-04-09 DIAGNOSIS — I10 Essential (primary) hypertension: Secondary | ICD-10-CM

## 2017-04-11 ENCOUNTER — Telehealth: Payer: Self-pay | Admitting: Nurse Practitioner

## 2017-04-11 NOTE — Telephone Encounter (Signed)
Let message with patients daughter Ronne BinningMckenzie.

## 2017-04-13 ENCOUNTER — Other Ambulatory Visit: Payer: Self-pay | Admitting: Family Medicine

## 2017-04-13 DIAGNOSIS — I10 Essential (primary) hypertension: Secondary | ICD-10-CM

## 2017-05-08 ENCOUNTER — Other Ambulatory Visit: Payer: Self-pay | Admitting: Nurse Practitioner

## 2017-05-08 DIAGNOSIS — I10 Essential (primary) hypertension: Secondary | ICD-10-CM

## 2017-05-09 NOTE — Telephone Encounter (Signed)
Please request patient to come in for evaluation for uncontrolled blood pressure. Will fill one month supply of BP medication to allow for patient to get appointment to be seen. Sent to [facility]

## 2017-07-02 ENCOUNTER — Other Ambulatory Visit: Payer: Self-pay | Admitting: Nurse Practitioner

## 2017-07-02 DIAGNOSIS — I10 Essential (primary) hypertension: Secondary | ICD-10-CM

## 2017-07-06 ENCOUNTER — Telehealth: Payer: Self-pay | Admitting: Nurse Practitioner

## 2017-07-06 DIAGNOSIS — I10 Essential (primary) hypertension: Secondary | ICD-10-CM

## 2017-07-08 NOTE — Telephone Encounter (Signed)
Pt needs appointment for any additional refills. He has been non-compliant with medication management and with requests for follow up.

## 2017-07-08 NOTE — Telephone Encounter (Signed)
Receiving request to refill blood pressure mediations; patient needs appointment due to uncontrolled blood pressure.

## 2017-07-08 NOTE — Telephone Encounter (Signed)
Patient needs appointment in order to obtain further refills.  

## 2017-07-08 NOTE — Telephone Encounter (Signed)
Spoke with pt and he scheduled an appointment with you for Wednesday

## 2017-07-13 ENCOUNTER — Ambulatory Visit: Payer: BLUE CROSS/BLUE SHIELD | Admitting: Family Medicine

## 2017-12-22 ENCOUNTER — Telehealth: Payer: Self-pay | Admitting: Family Medicine

## 2017-12-22 NOTE — Telephone Encounter (Signed)
Records will be sent

## 2017-12-22 NOTE — Telephone Encounter (Signed)
Received records request from [facility] - please provide the clinic with the requested documents. Thanks!

## 2018-07-13 ENCOUNTER — Telehealth: Payer: Self-pay

## 2018-07-13 DIAGNOSIS — Z20822 Contact with and (suspected) exposure to covid-19: Secondary | ICD-10-CM

## 2018-07-13 NOTE — Telephone Encounter (Signed)
Rec'd. Referral from [employer] to sched. Pt. For COVID testing, due to known exposure.  Attempted to call pt.; no answer; vm is full, and unable to leave a message.  Will call pt. again later.  Order placed.

## 2018-07-14 NOTE — Telephone Encounter (Signed)
Phone call to pt's home.  Spoke with patient's wife.  Scheduled pt. For COVID testing on 7/13 @ 9:15 AM.  Instructions given.  Wife verb. Understanding.  Wife reported she has been tested due to fever, headache and nausea.  Verb. concern about having her children checked.  Has not rec'd results yet.  Stated she called her children's PCP, and was advised to call the Health Dept.  She stated she was not happy with the PCP's response. Recommended to wait to see if her results are positive, but to keep the children quarantined until her results are given.  Wife verb. Understanding.  Advised of safety guidelines within the home to follow, to reduce spread of virus.  Verb. Understanding.

## 2018-07-17 ENCOUNTER — Other Ambulatory Visit: Payer: Self-pay

## 2018-07-17 DIAGNOSIS — Z20822 Contact with and (suspected) exposure to covid-19: Secondary | ICD-10-CM

## 2018-07-21 LAB — NOVEL CORONAVIRUS, NAA: SARS-CoV-2, NAA: NOT DETECTED

## 2019-09-05 ENCOUNTER — Other Ambulatory Visit: Payer: Self-pay

## 2019-09-05 DIAGNOSIS — Z20822 Contact with and (suspected) exposure to covid-19: Secondary | ICD-10-CM

## 2019-09-06 LAB — NOVEL CORONAVIRUS, NAA: SARS-CoV-2, NAA: NOT DETECTED

## 2020-09-22 ENCOUNTER — Ambulatory Visit
Admission: EM | Admit: 2020-09-22 | Discharge: 2020-09-22 | Disposition: A | Discharge status: Home / Self Care | Payer: BLUE CROSS/BLUE SHIELD | Attending: Emergency Medicine | Admitting: Emergency Medicine

## 2020-09-22 ENCOUNTER — Encounter: Payer: Self-pay | Admitting: Emergency Medicine

## 2020-09-22 ENCOUNTER — Other Ambulatory Visit: Payer: Self-pay

## 2020-09-22 DIAGNOSIS — R5383 Other fatigue: Secondary | ICD-10-CM

## 2020-09-22 DIAGNOSIS — Z20822 Contact with and (suspected) exposure to covid-19: Secondary | ICD-10-CM | POA: Diagnosis not present

## 2020-09-22 NOTE — ED Triage Notes (Signed)
PT's wife and daughter are COVID positive. PT reports headache, neck pain. Headache started Saturday AM.

## 2020-09-22 NOTE — ED Provider Notes (Signed)
Renaldo Fiddler    CSN: [id number] Arrival date & time: 09/22/20  1240      History   Chief Complaint Chief Complaint  Patient presents with   Headache     HPI DEQUAN KINDRED is a 50 y.o. male.  Patient presents with fatigue, headache, neck pain x 2 days.  He states his symptoms have improved and currently his only symptom is ongoing fatigue.  No treatments at home.  He denies fever, chills, rash, sore throat, cough, shortness of breath, or other symptoms.  His wife and daughter have COVID.  His medical history includes hypertension, hyperlipidemia, anxiety.  The history is provided by the patient and medical records.   Past Medical History:  Diagnosis Date   Anxiety    Hyperlipidemia    Hypertension     Patient Active Problem List   Diagnosis Date Noted   Hypertension 09/24/2015   Anxiety 09/24/2015   Hyperlipidemia 09/24/2015    Past Surgical History:  Procedure Laterality Date   NO PAST SURGERIES         Home Medications    Prior to Admission medications   Medication Sig Start Date End Date Taking? Authorizing Provider  atorvastatin (LIPITOR) 40 MG tablet Take 1 tablet (40 mg total) by mouth daily. 09/24/15  Yes Ellyn Hack, MD  lisinopril-hydrochlorothiazide (PRINZIDE,ZESTORETIC) 20-12.5 MG tablet Needs appointment for refills. 07/03/17  Yes Poulose, Percell Belt, NP    Family History Family History  Problem Relation Age of Onset   Hypertension Mother     Social History Social History   Tobacco Use   Smoking status: Every Day   Smokeless tobacco: Never  Substance Use Topics   Alcohol use: No    Alcohol/week: 0.0 standard drinks   Drug use: No     Allergies   Elemental sulfur   Review of Systems Review of Systems  Constitutional:  Positive for fatigue. Negative for chills and fever.  HENT:  Negative for ear pain and sore throat.   Eyes:  Negative for pain and visual disturbance.  Respiratory:  Negative for cough and  shortness of breath.   Cardiovascular:  Negative for chest pain and palpitations.  Gastrointestinal:  Negative for abdominal pain, diarrhea and vomiting.  Skin:  Negative for color change and rash.  Neurological:  Positive for headaches. Negative for syncope.  All other systems reviewed and are negative.   Physical Exam Triage Vital Signs ED Triage Vitals  Enc Vitals Group     BP 09/22/20 1304 (!) 152/91     Pulse Rate 09/22/20 1304 82     Resp 09/22/20 1304 18     Temp 09/22/20 1304 97.8 F (36.6 C)     Temp Source 09/22/20 1304 Oral     SpO2 09/22/20 1304 97 %     Weight --      Height --      Head Circumference --      Peak Flow --      Pain Score 09/22/20 1315 7     Pain Loc --      Pain Edu? --      Excl. in GC? --    No data found.  Updated Vital Signs BP (!) 152/91 (BP Location: Left Arm)   Pulse 82   Temp 97.8 F (36.6 C) (Oral)   Resp 18   SpO2 97%   Visual Acuity Right Eye Distance:   Left Eye Distance:   Bilateral Distance:  Right Eye Near:   Left Eye Near:    Bilateral Near:     Physical Exam Vitals and nursing note reviewed.  Constitutional:      General: He is not in acute distress.    Appearance: He is well-developed. He is not ill-appearing.  HENT:     Head: Normocephalic and atraumatic.     Right Ear: Tympanic membrane normal.     Left Ear: Tympanic membrane normal.     Nose: Nose normal.     Mouth/Throat:     Mouth: Mucous membranes are moist.     Pharynx: Oropharynx is clear.  Eyes:     Conjunctiva/sclera: Conjunctivae normal.  Cardiovascular:     Rate and Rhythm: Normal rate and regular rhythm.     Heart sounds: Normal heart sounds.  Pulmonary:     Effort: Pulmonary effort is normal. No respiratory distress.     Breath sounds: Normal breath sounds.  Abdominal:     Palpations: Abdomen is soft.     Tenderness: There is no abdominal tenderness.  Musculoskeletal:     Cervical back: Neck supple.  Skin:    General: Skin is warm  and dry.  Neurological:     General: No focal deficit present.     Mental Status: He is alert and oriented to person, place, and time.     Gait: Gait normal.  Psychiatric:        Mood and Affect: Mood normal.        Behavior: Behavior normal.     UC Treatments / Results  Labs (all labs ordered are listed, but only abnormal results are displayed) Labs Reviewed  NOVEL CORONAVIRUS, NAA    EKG   Radiology No results found.  Procedures Procedures (including critical care time)  Medications Ordered in UC Medications - No data to display  Initial Impression / Assessment and Plan / UC Course  I have reviewed the triage vital signs and the nursing notes.  Pertinent labs & imaging results that were available during my care of the patient were reviewed by me and considered in my medical decision making (see chart for details).  Exposure to COVID, fatigue.  COVID pending.  Instructed patient to self quarantine per CDC guidelines.  Discussed symptomatic treatment including Tylenol or ibuprofen, rest, hydration.  Instructed patient to follow up with PCP if symptoms are not improving.  Patient agrees to plan of care.    Final Clinical Impressions(s) / UC Diagnoses   Final diagnoses:  Exposure to COVID-19 virus  Fatigue, unspecified type     Discharge Instructions      Your COVID test is pending.  You should self quarantine until the test result is back.    Take Tylenol or ibuprofen as needed for fever or discomfort.  Rest and keep yourself hydrated.    Follow-up with your primary care provider if your symptoms are not improving.         ED Prescriptions   None    PDMP not reviewed this encounter.   Mickie Bail, NP 09/22/20 1347

## 2020-09-22 NOTE — Discharge Instructions (Addendum)
Your COVID test is pending.  You should self quarantine until the test result is back.    Take Tylenol or ibuprofen as needed for fever or discomfort.  Rest and keep yourself hydrated.    Follow-up with your primary care provider if your symptoms are not improving.     

## 2020-09-23 LAB — SARS-COV-2, NAA 2 DAY TAT

## 2020-09-23 LAB — NOVEL CORONAVIRUS, NAA: SARS-CoV-2, NAA: NOT DETECTED

## 2021-11-13 ENCOUNTER — Encounter: Payer: Self-pay | Admitting: Emergency Medicine

## 2022-03-03 ENCOUNTER — Other Ambulatory Visit: Payer: Self-pay | Admitting: Nurse Practitioner

## 2022-03-03 NOTE — Telephone Encounter (Signed)
Need an appt and fasting labs

## 2022-05-12 ENCOUNTER — Other Ambulatory Visit: Payer: Self-pay | Admitting: Nurse Practitioner

## 2022-05-28 ENCOUNTER — Other Ambulatory Visit: Payer: Self-pay | Admitting: Nurse Practitioner

## 2022-05-28 ENCOUNTER — Ambulatory Visit (INDEPENDENT_AMBULATORY_CARE_PROVIDER_SITE_OTHER): Payer: 59 | Admitting: Nurse Practitioner

## 2022-05-28 VITALS — BP 164/70 | HR 82 | Ht 72.0 in | Wt 192.0 lb

## 2022-05-28 DIAGNOSIS — F419 Anxiety disorder, unspecified: Secondary | ICD-10-CM | POA: Diagnosis not present

## 2022-05-28 DIAGNOSIS — E785 Hyperlipidemia, unspecified: Secondary | ICD-10-CM

## 2022-05-28 DIAGNOSIS — I10 Essential (primary) hypertension: Secondary | ICD-10-CM

## 2022-05-28 DIAGNOSIS — E782 Mixed hyperlipidemia: Secondary | ICD-10-CM

## 2022-05-28 MED ORDER — LISINOPRIL-HYDROCHLOROTHIAZIDE 20-12.5 MG PO TABS: ORAL_TABLET | ORAL | 1 refills | Status: DC

## 2022-05-28 MED ORDER — ALBUTEROL SULFATE HFA 108 (90 BASE) MCG/ACT IN AERS: 2.0000 | INHALATION_SPRAY | Freq: Four times a day (QID) | RESPIRATORY_TRACT | 2 refills | Status: AC | PRN

## 2022-05-28 MED ORDER — LISINOPRIL-HYDROCHLOROTHIAZIDE 20-12.5 MG PO TABS
ORAL_TABLET | ORAL | 11 refills | Status: AC
Start: 2022-05-28 — End: ?

## 2022-05-28 MED ORDER — MUPIROCIN 2 % EX OINT: 1.0000 | TOPICAL_OINTMENT | Freq: Two times a day (BID) | CUTANEOUS | 2 refills | Status: AC

## 2022-05-28 MED ORDER — CLOBETASOL PROPIONATE 0.05 % EX CREA: 1.0000 | TOPICAL_CREAM | Freq: Two times a day (BID) | CUTANEOUS | 2 refills | Status: AC

## 2022-05-28 MED ORDER — ATORVASTATIN CALCIUM 40 MG PO TABS
40.0000 mg | ORAL_TABLET | Freq: Every day | ORAL | 3 refills | Status: AC
Start: 2022-05-28 — End: ?

## 2022-05-28 NOTE — Progress Notes (Signed)
Established Patient Office Visit  Subjective:  Patient ID: John Ayers, male    DOB: 28-Apr-1970  Age: 52 y.o. MRN: [id number]  Chief Complaint  Patient presents with   Follow-up    BP Check & Spot on Arm    Acute visit.  BP elevated.  Patient hasn't been here.  He also has some skin lesion which has enlarged.  Derm referral.      No other concerns at this time.   Past Medical History:  Diagnosis Date   Anxiety    Hyperlipidemia    Hypertension     Past Surgical History:  Procedure Laterality Date   NO PAST SURGERIES      Social History   Socioeconomic History   Marital status: Married    Spouse name: Not on file   Number of children: Not on file   Years of education: Not on file   Highest education level: Not on file  Occupational History   Not on file  Tobacco Use   Smoking status: Every Day   Smokeless tobacco: Never  Substance and Sexual Activity   Alcohol use: No    Alcohol/week: 0.0 standard drinks of alcohol   Drug use: No   Sexual activity: Yes    Partners: Female  Other Topics Concern   Not on file  Social History Narrative   ** Merged History Encounter **       Social Determinants of Health   Financial Resource Strain: Not on file  Food Insecurity: Not on file  Transportation Needs: Not on file  Physical Activity: Not on file  Stress: Not on file  Social Connections: Not on file  Intimate Partner Violence: Not on file    Family History  Problem Relation Age of Onset   Hypertension Mother     Allergies  Allergen Reactions   Elemental Sulfur     Review of Systems  Constitutional: Negative.   HENT: Negative.    Eyes: Negative.   Respiratory: Negative.    Cardiovascular: Negative.   Gastrointestinal: Negative.   Genitourinary: Negative.   Musculoskeletal: Negative.   Skin:        Rash to left forearm, chronic Intermittent skin abscesses  Neurological: Negative.   Endo/Heme/Allergies: Negative.    Psychiatric/Behavioral:  The patient is nervous/anxious.        Objective:   BP (!) 164/70   Pulse 82   Ht 6' (1.829 m)   Wt 192 lb (87.1 kg)   SpO2 97%   BMI 26.04 kg/m   Vitals:   05/28/22 0952  BP: (!) 164/70  Pulse: 82  Height: 6' (1.829 m)  Weight: 192 lb (87.1 kg)  SpO2: 97%  BMI (Calculated): 26.03    Physical Exam Vitals reviewed.  Constitutional:      Appearance: Normal appearance.  HENT:     Head: Normocephalic.     Nose: Nose normal.     Mouth/Throat:     Mouth: Mucous membranes are moist.  Eyes:     Pupils: Pupils are equal, round, and reactive to light.  Cardiovascular:     Rate and Rhythm: Normal rate and regular rhythm.  Pulmonary:     Effort: Pulmonary effort is normal.     Breath sounds: Normal breath sounds.  Abdominal:     General: Bowel sounds are normal.     Palpations: Abdomen is soft.  Musculoskeletal:        General: Normal range of motion.     Cervical back:  Normal range of motion and neck supple.  Skin:    Findings: Lesion and rash present.  Neurological:     Mental Status: He is alert and oriented to person, place, and time.  Psychiatric:        Mood and Affect: Mood normal.        Behavior: Behavior normal.      No results found for any visits on 05/28/22.  No results found for this or any previous visit (from the past 2160 hour(s)).    Assessment & Plan:   Problem List Items Addressed This Visit   None   No follow-ups on file.   Total time spent: 35 minutes  Orson Eva, NP  05/28/2022   This document may have been prepared by [facility] Voice Recognition software and as such may include unintentional dictation errors.

## 2022-05-28 NOTE — Addendum Note (Signed)
Addended by: Orson Eva on: 05/28/2022 03:23 PM   Modules accepted: Orders

## 2022-05-28 NOTE — Patient Instructions (Signed)
1) Labs today 2) Refills 3) Rescue inhaler 4) Follow up prn  5) Check BP at home in 2-3 days, send to My Chart Message

## 2022-05-29 LAB — CMP14+EGFR
ALT: 19 IU/L (ref 0–44)
AST: 23 IU/L (ref 0–40)
Albumin/Globulin Ratio: 1.4 (ref 1.2–2.2)
Albumin: 4.3 g/dL (ref 3.8–4.9)
Alkaline Phosphatase: 77 IU/L (ref 44–121)
BUN/Creatinine Ratio: 13 (ref 9–20)
BUN: 16 mg/dL (ref 6–24)
Bilirubin Total: 0.5 mg/dL (ref 0.0–1.2)
CO2: 25 mmol/L (ref 20–29)
Calcium: 10.1 mg/dL (ref 8.7–10.2)
Chloride: 98 mmol/L (ref 96–106)
Creatinine, Ser: 1.21 mg/dL (ref 0.76–1.27)
Globulin, Total: 3.1 g/dL (ref 1.5–4.5)
Glucose: 92 mg/dL (ref 70–99)
Potassium: 4.2 mmol/L (ref 3.5–5.2)
Sodium: 139 mmol/L (ref 134–144)
Total Protein: 7.4 g/dL (ref 6.0–8.5)
eGFR: 72 mL/min/{1.73_m2} (ref >59)

## 2022-05-29 LAB — LIPID PANEL
Chol/HDL Ratio: 6.3 ratio — ABNORMAL HIGH (ref 0.0–5.0)
Cholesterol, Total: 215 mg/dL — ABNORMAL HIGH (ref 100–199)
HDL: 34 mg/dL — ABNORMAL LOW (ref >39)
LDL Chol Calc (NIH): 152 mg/dL — ABNORMAL HIGH (ref 0–99)
Triglycerides: 156 mg/dL — ABNORMAL HIGH (ref 0–149)
VLDL Cholesterol Cal: 29 mg/dL (ref 5–40)

## 2022-05-29 LAB — TSH: TSH: 0.838 u[IU]/mL (ref 0.450–4.500)

## 2022-05-31 MED ORDER — ROSUVASTATIN CALCIUM 40 MG PO TABS: 40.0000 mg | ORAL_TABLET | Freq: Every day | ORAL | 3 refills | Status: AC

## 2022-05-31 NOTE — Addendum Note (Signed)
Addended byKatherine Mantle on: 05/31/2022 10:51 AM   Modules accepted: Orders

## 2022-06-22 ENCOUNTER — Other Ambulatory Visit: Payer: Self-pay | Admitting: Nurse Practitioner

## 2022-08-06 ENCOUNTER — Other Ambulatory Visit: Payer: Self-pay | Admitting: Nurse Practitioner

## 2022-09-22 DIAGNOSIS — L578 Other skin changes due to chronic exposure to nonionizing radiation: Secondary | ICD-10-CM | POA: Diagnosis not present

## 2022-09-22 DIAGNOSIS — D2271 Melanocytic nevi of right lower limb, including hip: Secondary | ICD-10-CM | POA: Diagnosis not present

## 2022-09-22 DIAGNOSIS — L2089 Other atopic dermatitis: Secondary | ICD-10-CM | POA: Diagnosis not present

## 2022-09-22 DIAGNOSIS — Z808 Family history of malignant neoplasm of other organs or systems: Secondary | ICD-10-CM | POA: Diagnosis not present

## 2022-09-22 DIAGNOSIS — D225 Melanocytic nevi of trunk: Secondary | ICD-10-CM | POA: Diagnosis not present

## 2022-09-22 DIAGNOSIS — D2272 Melanocytic nevi of left lower limb, including hip: Secondary | ICD-10-CM | POA: Diagnosis not present

## 2022-09-22 DIAGNOSIS — L821 Other seborrheic keratosis: Secondary | ICD-10-CM | POA: Diagnosis not present

## 2022-09-22 DIAGNOSIS — D2262 Melanocytic nevi of left upper limb, including shoulder: Secondary | ICD-10-CM | POA: Diagnosis not present

## 2022-09-22 DIAGNOSIS — D2261 Melanocytic nevi of right upper limb, including shoulder: Secondary | ICD-10-CM | POA: Diagnosis not present

## 2022-09-22 DIAGNOSIS — X32XXXA Exposure to sunlight, initial encounter: Secondary | ICD-10-CM | POA: Diagnosis not present

## 2022-12-08 ENCOUNTER — Other Ambulatory Visit: Payer: Self-pay | Admitting: Family

## 2023-01-30 ENCOUNTER — Other Ambulatory Visit: Payer: Self-pay | Admitting: Family

## 2023-02-03 ENCOUNTER — Other Ambulatory Visit: Payer: Self-pay | Admitting: Family

## 2023-02-07 ENCOUNTER — Other Ambulatory Visit: Payer: Self-pay

## 2023-02-14 ENCOUNTER — Other Ambulatory Visit: Payer: Self-pay | Admitting: Family

## 2023-03-08 ENCOUNTER — Other Ambulatory Visit: Payer: Self-pay | Admitting: Family

## 2023-03-17 ENCOUNTER — Other Ambulatory Visit: Payer: Self-pay | Admitting: Family

## 2023-04-06 ENCOUNTER — Other Ambulatory Visit: Payer: Self-pay | Admitting: Family
